# Patient Record
Sex: Male | Born: 2000 | Race: White | Hispanic: No | Marital: Single | Smoking: Never smoker
Health system: Southern US, Community
[De-identification: ages and names within clinical notes are randomized; demographics above are authoritative.]

---
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
## 2021-06-13 ENCOUNTER — Encounter: Payer: Self-pay | Admitting: Family Medicine

## 2021-06-13 ENCOUNTER — Ambulatory Visit
Admission: EM | Admit: 2021-06-13 | Discharge: 2021-06-13 | Disposition: A | Discharge status: Home / Self Care | Payer: BC Managed Care – PPO | Attending: Family Medicine | Admitting: Family Medicine

## 2021-06-13 ENCOUNTER — Other Ambulatory Visit: Payer: Self-pay

## 2021-06-13 DIAGNOSIS — A084 Viral intestinal infection, unspecified: Secondary | ICD-10-CM

## 2021-06-13 MED ORDER — ONDANSETRON HCL 4 MG PO TABS: 4.0000 mg | ORAL_TABLET | Freq: Four times a day (QID) | ORAL | 0 refills | Status: AC

## 2021-06-13 NOTE — Discharge Instructions (Addendum)
I have sent in Zofran for you to take one tablet every 8 hours as needed for nausea.  Follow up with this office or with primary care if symptoms are persisting.  Follow up in the ER for high fever, trouble swallowing, trouble breathing, other concerning symptoms.  

## 2021-06-13 NOTE — ED Provider Notes (Signed)
[facility]   [id number] 06/13/21 Arrival Time: 1411  CC: ABDOMINAL PAIN  SUBJECTIVE:  Nicholas Sampson is a 20 y.o. male who presents with continued abdominal pain after having 2 days of nausea/vomiting/diarrhea. Denies a precipitating event, trauma, close contacts with similar symptoms, recent travel or antibiotic use. Reports recent Covid diagnosis on 06/02/21 with home test. Reports abdominal cramping. Has taken tums and prilosec for this with little relief. Denies alleviating or aggravating factors. Denies similar symptoms in the past. Last BM today. Reports that his employer is requiring a note to return to work.  Denies fever, chills, appetite changes, weight changes, chest pain, SOB, constipation, hematochezia, melena, dysuria, difficulty urinating, increased frequency or urgency, flank pain, loss of bowel or bladder function,   ROS: As per HPI.  All other pertinent ROS negative.     History reviewed. No pertinent past medical history. History reviewed. No pertinent surgical history. Not on File No current facility-administered medications on file prior to encounter.   No current outpatient medications on file prior to encounter.   Social History   Socioeconomic History   Marital status: Single    Spouse name: Not on file   Number of children: Not on file   Years of education: Not on file   Highest education level: Not on file  Occupational History   Not on file  Tobacco Use   Smoking status: Not on file   Smokeless tobacco: Not on file  Substance and Sexual Activity   Alcohol use: Not on file   Drug use: Not on file   Sexual activity: Not on file  Other Topics Concern   Not on file  Social History Narrative   Not on file   Social Determinants of Health   Financial Resource Strain: Not on file  Food Insecurity: Not on file  Transportation Needs: Not on file  Physical Activity: Not on file  Stress: Not on file  Social Connections: Not on file  Intimate  Partner Violence: Not on file   History reviewed. No pertinent family history.   OBJECTIVE:  Vitals:   06/13/21 1540  BP: 117/78  Pulse: 61  Resp: 20  Temp: 97.7 F (36.5 C)  TempSrc: Tympanic  SpO2: 97%    General appearance: Alert; NAD HEENT: NCAT.  Oropharynx clear.  Lungs: clear to auscultation bilaterally without adventitious breath sounds Heart: regular rate and rhythm.  Radial pulses 2+ symmetrical bilaterally Abdomen: soft, non-distended; normal active bowel sounds; non-tender to light and deep palpation; nontender at McBurney's point; negative Murphy's sign; negative rebound; no guarding Back: no CVA tenderness Extremities: no edema; symmetrical with no gross deformities Skin: warm and dry Neurologic: normal gait Psychological: alert and cooperative; normal mood and affect  LABS: No results found for this or any previous visit (from the past 24 hour(s)).  DIAGNOSTIC STUDIES: No results found.   ASSESSMENT & PLAN:  1. Viral gastroenteritis     Meds ordered this encounter  Medications   ondansetron (ZOFRAN) 4 MG tablet    Sig: Take 1 tablet (4 mg total) by mouth every 6 (six) hours.    Dispense:  12 tablet    Refill:  0    Order Specific Question:   Supervising Provider    Answer:   Merrilee Jansky [id number]     Get rest and drink plenty of fluids Zofran prescribed.  Take as directed.    DIET Instructions:  30 minutes after taking nausea medicine, begin with sips of clear liquids. If  able to hold down 2 - 4 ounces for 30 minutes, begin drinking more. Increase your fluid intake to replace losses. Clear liquids only for 24 hours (water, tea, sport drinks, clear flat ginger ale or cola and juices, broth, jello, popsicles, ect). Advance to bland foods, applesauce, rice, baked or boiled chicken, ect. Avoid milk, greasy foods and anything that doesn't agree with you.  If you experience new or worsening symptoms return or go to ER such as fever, chills,  nausea, vomiting, diarrhea, bloody or dark tarry stools, constipation, urinary symptoms, worsening abdominal discomfort, symptoms that do not improve with medications, inability to keep fluids down.  Reviewed expectations re: course of current medical issues. Questions answered. Outlined signs and symptoms indicating need for more acute intervention. Patient verbalized understanding. After Visit Summary given.   Moshe Cipro, NP 06/13/21 1620

## 2021-06-13 NOTE — ED Triage Notes (Signed)
Provider triage  

## 2022-01-29 ENCOUNTER — Ambulatory Visit
Admission: EM | Admit: 2022-01-29 | Discharge: 2022-01-29 | Disposition: A | Discharge status: Home / Self Care | Payer: Managed Care, Other (non HMO) | Attending: Family Medicine | Admitting: Family Medicine

## 2022-01-29 ENCOUNTER — Other Ambulatory Visit: Payer: Self-pay

## 2022-01-29 DIAGNOSIS — R3129 Other microscopic hematuria: Secondary | ICD-10-CM

## 2022-01-29 DIAGNOSIS — R102 Pelvic and perineal pain: Secondary | ICD-10-CM | POA: Insufficient documentation

## 2022-01-29 LAB — POCT URINALYSIS DIP (MANUAL ENTRY)
Bilirubin, UA: NEGATIVE
Glucose, UA: NEGATIVE mg/dL
Ketones, POC UA: NEGATIVE mg/dL
Leukocytes, UA: NEGATIVE
Nitrite, UA: NEGATIVE
Protein Ur, POC: NEGATIVE mg/dL
Spec Grav, UA: 1.025 (ref 1.010–1.025)
Urobilinogen, UA: 0.2 E.U./dL
pH, UA: 7 (ref 5.0–8.0)

## 2022-01-29 NOTE — ED Triage Notes (Signed)
Pt presents with right side flank pain and lower abdominal ain for past week

## 2022-02-01 LAB — URINE CULTURE: Culture: NO GROWTH

## 2022-02-02 NOTE — ED Provider Notes (Signed)
[facility]    CSN: [id number] Arrival date & time: 01/29/22  1911      History   Chief Complaint Chief Complaint  Patient presents with   Flank Pain    HPI Nicholas Sampson is a 21 y.o. male.   Presenting today with several days of right flank pain, lower abdominal pain that worsened some today while at work. Denies hematuria, dysuria, N/V/D, fever, injury. Not trying anything OTC for sxs and no past hx of similar issues.   History reviewed. No pertinent past medical history.  There are no problems to display for this patient.   History reviewed. No pertinent surgical history.     Home Medications    Prior to Admission medications   Medication Sig Start Date End Date Taking? Authorizing Provider  ondansetron (ZOFRAN) 4 MG tablet Take 1 tablet (4 mg total) by mouth every 6 (six) hours. 06/13/21   Faustino Congress, NP    Family History History reviewed. No pertinent family history.  Social History     Allergies   Patient has no known allergies.   Review of Systems Review of Systems PER HPI  Physical Exam Triage Vital Signs ED Triage Vitals [01/29/22 1931]  Enc Vitals Group     BP 124/81     Pulse Rate 65     Resp 16     Temp 98.2 F (36.8 C)     Temp Source Oral     SpO2 98 %     Weight      Height      Head Circumference      Peak Flow      Pain Score      Pain Loc      Pain Edu?      Excl. in [place]?    No data found.  Updated Vital Signs BP 124/81 (BP Location: Right Arm)    Pulse 65    Temp 98.2 F (36.8 C) (Oral)    Resp 16    SpO2 98%   Visual Acuity Right Eye Distance:   Left Eye Distance:   Bilateral Distance:    Right Eye Near:   Left Eye Near:    Bilateral Near:     Physical Exam Vitals and nursing note reviewed.  Constitutional:      Appearance: Normal appearance.  HENT:     Head: Atraumatic.  Eyes:     Extraocular Movements: Extraocular movements intact.     Conjunctiva/sclera: Conjunctivae normal.   Cardiovascular:     Rate and Rhythm: Normal rate and regular rhythm.     Heart sounds: Normal heart sounds.  Pulmonary:     Effort: Pulmonary effort is normal.     Breath sounds: Normal breath sounds.  Abdominal:     General: Bowel sounds are normal. There is no distension.     Palpations: Abdomen is soft.     Tenderness: There is abdominal tenderness. There is no right CVA tenderness, left CVA tenderness or guarding.     Comments: Minimal RLQ and suprapubic ttp  Musculoskeletal:        General: Normal range of motion.     Cervical back: Normal range of motion and neck supple.  Skin:    General: Skin is warm and dry.  Neurological:     General: No focal deficit present.     Mental Status: He is oriented to person, place, and time.  Psychiatric:        Mood and Affect: Mood  normal.        Thought Content: Thought content normal.        Judgment: Judgment normal.     UC Treatments / Results  Labs (all labs ordered are listed, but only abnormal results are displayed) Labs Reviewed  POCT URINALYSIS DIP (MANUAL ENTRY) - Abnormal; Notable for the following components:      Result Value   Blood, UA trace-intact (*)    All other components within normal limits  URINE CULTURE    EKG   Radiology No results found.  Procedures Procedures (including critical care time)  Medications Ordered in UC Medications - No data to display  Initial Impression / Assessment and Plan / UC Course  I have reviewed the triage vital signs and the nursing notes.  Pertinent labs & imaging results that were available during my care of the patient were reviewed by me and considered in my medical decision making (see chart for details).     Trace hg in U/A, vitals and exam overall reassuring. Unclear if kidney stone, dehydration or other cause at this time. Offered toradol and other supportive medication but this was declined. He wishes to increase fluids and continue to monitor.  Return for  acutely worsening sxs.  Final Clinical Impressions(s) / UC Diagnoses   Final diagnoses:  Microscopic hematuria  Suprapubic pain   Discharge Instructions   None    ED Prescriptions   None    PDMP not reviewed this encounter.   Volney American, [place] 02/02/22 2256

## 2023-06-25 ENCOUNTER — Ambulatory Visit
Admission: EM | Admit: 2023-06-25 | Discharge: 2023-06-25 | Disposition: A | Discharge status: Home / Self Care | Payer: Managed Care, Other (non HMO) | Attending: Family Medicine | Admitting: Family Medicine

## 2023-06-25 DIAGNOSIS — L600 Ingrowing nail: Secondary | ICD-10-CM

## 2023-06-25 MED ORDER — CEPHALEXIN 500 MG PO CAPS: 500.0000 mg | ORAL_CAPSULE | Freq: Two times a day (BID) | ORAL | 0 refills | Status: AC

## 2023-06-25 NOTE — ED Triage Notes (Signed)
Pt reports he thins he has an infection in his right big toe x 3 days. States it is sore.

## 2023-06-25 NOTE — ED Provider Notes (Signed)
[facility]    CSN: [id number] Arrival date & time: 06/25/23  1341      History   Chief Complaint No chief complaint on file.   HPI Nicholas Sampson is a 22 y.o. male.   Presenting today with 3-day history of pain, redness and drainage to the right great toe at the nail edge.  States that she was rubbing against it seems to be making it worse so he had a social sandals today and could not perform his work duties.  Denies fever, chills, decreased range of motion, numbness, tingling, known injury to the area.  Trying over-the-counter pain relievers with minimal relief.  Also was able to take out the nail edge yesterday at home and had some drainage after this    History reviewed. No pertinent past medical history.  There are no problems to display for this patient.   History reviewed. No pertinent surgical history.     Home Medications    Prior to Admission medications   Medication Sig Start Date End Date Taking? Authorizing Provider  cephALEXin (KEFLEX) 500 MG capsule Take 1 capsule (500 mg total) by mouth 2 (two) times daily. 06/25/23  Yes Particia Nearing, PA-C  ondansetron (ZOFRAN) 4 MG tablet Take 1 tablet (4 mg total) by mouth every 6 (six) hours. 06/13/21   Moshe Cipro, NP    Family History History reviewed. No pertinent family history.  Social History Social History   Tobacco Use   Smoking status: Never   Smokeless tobacco: Never  Vaping Use   Vaping Use: Some days  Substance Use Topics   Alcohol use: Yes     Allergies   Patient has no known allergies.   Review of Systems Review of Systems PER HPI  Physical Exam Triage Vital Signs ED Triage Vitals  Enc Vitals Group     BP 06/25/23 1446 (!) 166/95     Pulse Rate 06/25/23 1446 97     Resp 06/25/23 1446 16     Temp 06/25/23 1446 98.2 F (36.8 C)     Temp Source 06/25/23 1446 Oral     SpO2 06/25/23 1446 97 %     Weight --      Height --      Head Circumference --       Peak Flow --      Pain Score 06/25/23 1506 0     Pain Loc --      Pain Edu? --      Excl. in [place]? --    No data found.  Updated Vital Signs BP (!) 166/95 (BP Location: Right Arm)   Pulse 97   Temp 98.2 F (36.8 C) (Oral)   Resp 16   SpO2 97%   Visual Acuity Right Eye Distance:   Left Eye Distance:   Bilateral Distance:    Right Eye Near:   Left Eye Near:    Bilateral Near:     Physical Exam Vitals and nursing note reviewed.  Constitutional:      Appearance: Normal appearance.  HENT:     Head: Atraumatic.  Eyes:     Extraocular Movements: Extraocular movements intact.     Conjunctiva/sclera: Conjunctivae normal.  Cardiovascular:     Rate and Rhythm: Normal rate and regular rhythm.  Pulmonary:     Effort: Pulmonary effort is normal.     Breath sounds: Normal breath sounds.  Musculoskeletal:        General: Swelling and tenderness present. No deformity  or signs of injury. Normal range of motion.     Cervical back: Normal range of motion and neck supple.  Skin:    General: Skin is warm and dry.     Findings: Erythema present. No bruising.     Comments: Erythema, edema to the medial great toenail edge on the right foot.  No active drainage or bleeding currently.  Nail edge able to be freed from skin  Neurological:     General: No focal deficit present.     Mental Status: He is oriented to person, place, and time.     Comments: Right foot neurovascularly intact  Psychiatric:        Mood and Affect: Mood normal.        Thought Content: Thought content normal.        Judgment: Judgment normal.      UC Treatments / Results  Labs (all labs ordered are listed, but only abnormal results are displayed) Labs Reviewed - No data to display  EKG   Radiology No results found.  Procedures Procedures (including critical care time)  Medications Ordered in UC Medications - No data to display  Initial Impression / Assessment and Plan / UC Course  I have reviewed  the triage vital signs and the nursing notes.  Pertinent labs & imaging results that were available during my care of the patient were reviewed by me and considered in my medical decision making (see chart for details).     Treat with Keflex, warm Epsom salt soaks, elevation and ensuring proper shoes.  Work note given.  Return for worsening symptoms.  Final Clinical Impressions(s) / UC Diagnoses   Final diagnoses:  Ingrown toenail of right foot with infection   Discharge Instructions   None    ED Prescriptions     Medication Sig Dispense Auth. Provider   cephALEXin (KEFLEX) 500 MG capsule Take 1 capsule (500 mg total) by mouth 2 (two) times daily. 14 capsule Particia Nearing, [place]      PDMP not reviewed this encounter.   Particia Nearing, [place] 06/25/23 1515
